# Patient Record
Sex: Male | Born: 1950 | Race: White | Hispanic: No | Marital: Married | Smoking: Never smoker
Health system: Southern US, Community
[De-identification: ages and names within clinical notes are randomized; demographics above are authoritative.]

## PROBLEM LIST (undated history)

## (undated) DIAGNOSIS — K219 Gastro-esophageal reflux disease without esophagitis: Secondary | ICD-10-CM

## (undated) DIAGNOSIS — R945 Abnormal results of liver function studies: Secondary | ICD-10-CM

## (undated) DIAGNOSIS — F419 Anxiety disorder, unspecified: Secondary | ICD-10-CM

## (undated) DIAGNOSIS — M1990 Unspecified osteoarthritis, unspecified site: Secondary | ICD-10-CM

## (undated) DIAGNOSIS — E119 Type 2 diabetes mellitus without complications: Secondary | ICD-10-CM

## (undated) DIAGNOSIS — R7989 Other specified abnormal findings of blood chemistry: Secondary | ICD-10-CM

## (undated) DIAGNOSIS — J302 Other seasonal allergic rhinitis: Secondary | ICD-10-CM

## (undated) DIAGNOSIS — E785 Hyperlipidemia, unspecified: Secondary | ICD-10-CM

## (undated) DIAGNOSIS — G4733 Obstructive sleep apnea (adult) (pediatric): Principal | ICD-10-CM

## (undated) DIAGNOSIS — I10 Essential (primary) hypertension: Secondary | ICD-10-CM

## (undated) HISTORY — DX: Gastro-esophageal reflux disease without esophagitis: K21.9

## (undated) HISTORY — DX: Unspecified osteoarthritis, unspecified site: M19.90

## (undated) HISTORY — DX: Other specified abnormal findings of blood chemistry: R79.89

## (undated) HISTORY — DX: Anxiety disorder, unspecified: F41.9

## (undated) HISTORY — DX: Hyperlipidemia, unspecified: E78.5

## (undated) HISTORY — DX: Obstructive sleep apnea (adult) (pediatric): G47.33

## (undated) HISTORY — DX: Abnormal results of liver function studies: R94.5

## (undated) HISTORY — DX: Type 2 diabetes mellitus without complications: E11.9

## (undated) HISTORY — DX: Essential (primary) hypertension: I10

## (undated) HISTORY — DX: Other seasonal allergic rhinitis: J30.2

## (undated) HISTORY — PX: CATARACT EXTRACTION: SUR2

---

## 2004-10-08 ENCOUNTER — Ambulatory Visit: Payer: Self-pay | Admitting: Gastroenterology

## 2004-10-20 ENCOUNTER — Ambulatory Visit: Payer: Self-pay | Admitting: Gastroenterology

## 2004-10-23 ENCOUNTER — Ambulatory Visit: Payer: Self-pay | Admitting: Gastroenterology

## 2004-11-17 ENCOUNTER — Ambulatory Visit: Payer: Self-pay | Admitting: Gastroenterology

## 2004-12-04 ENCOUNTER — Ambulatory Visit: Payer: Self-pay | Admitting: Gastroenterology

## 2004-12-19 ENCOUNTER — Encounter (INDEPENDENT_AMBULATORY_CARE_PROVIDER_SITE_OTHER): Payer: Self-pay | Admitting: Specialist

## 2004-12-19 ENCOUNTER — Ambulatory Visit (HOSPITAL_COMMUNITY): Admission: RE | Admit: 2004-12-19 | Discharge: 2004-12-19 | Payer: Self-pay | Admitting: Gastroenterology

## 2005-01-26 ENCOUNTER — Ambulatory Visit: Payer: Self-pay | Admitting: Pulmonary Disease

## 2005-03-06 ENCOUNTER — Ambulatory Visit: Payer: Self-pay | Admitting: Pulmonary Disease

## 2005-03-15 ENCOUNTER — Ambulatory Visit (HOSPITAL_BASED_OUTPATIENT_CLINIC_OR_DEPARTMENT_OTHER): Admission: RE | Admit: 2005-03-15 | Discharge: 2005-03-15 | Payer: Self-pay | Admitting: Pulmonary Disease

## 2005-03-27 ENCOUNTER — Ambulatory Visit: Payer: Self-pay | Admitting: Pulmonary Disease

## 2006-11-09 HISTORY — PX: COLONOSCOPY: SHX174

## 2007-10-10 ENCOUNTER — Ambulatory Visit: Payer: Self-pay | Admitting: Gastroenterology

## 2007-10-27 ENCOUNTER — Ambulatory Visit: Payer: Self-pay | Admitting: Gastroenterology

## 2007-10-27 ENCOUNTER — Encounter: Payer: Self-pay | Admitting: Gastroenterology

## 2011-03-27 NOTE — Procedures (Signed)
NAME:  Trevor Ellis, Trevor Ellis NO.:  [id number]   MEDICAL RECORD NO.:  [id number]          PATIENT TYPE:  OUT   LOCATION:  SLEEP CENTER                 FACILITY:  [facility]   PHYSICIAN:  Marcelyn Bruins, M.D. [facility] DATE OF BIRTH:  Apr 01, 1951   DATE OF STUDY:  03/15/2005                              NOCTURNAL POLYSOMNOGRAM   REFERRING PHYSICIAN:  Dr. Marcelyn Bruins   DATE OF STUDY:  Mar 15, 2005   INDICATION FOR THE STUDY:  Hypersomnia with sleep apnea. The patient returns  for pressure optimization.   SLEEP ARCHITECTURE:  The patient had a total sleep time of 322 minutes with  decreased REM and very little slow wave sleep. The patient's sleep  efficiency was 84%. Sleep onset latency was normal as was REM onset.   IMPRESSION:  1.  Continuous positive airway pressure titration study reveals good control      of previously documented severe obstructive sleep apnea with a      continuous positive airway pressure of 18 cm. The patient had fairly      good control at a level of 16 even through rapid eye movement however,      once he turned supine the events began to increase in frequency. There      was some breakthrough snoring noted on this setting but acceptable. The      patient used a medium Ultram Mirage Full Face Mask from home.  2.  No clinically significant cardiac arrhythmias.      KC/MEDQ  D:  03/25/2005 11:46:01  T:  03/25/2005 12:42:51  Job:  [id number]

## 2012-09-08 ENCOUNTER — Encounter: Payer: Self-pay | Admitting: Gastroenterology

## 2012-12-27 ENCOUNTER — Encounter: Payer: Self-pay | Admitting: Gastroenterology

## 2013-01-19 ENCOUNTER — Encounter: Payer: Self-pay | Admitting: Gastroenterology

## 2013-01-19 ENCOUNTER — Ambulatory Visit (AMBULATORY_SURGERY_CENTER): Payer: 59 | Admitting: *Deleted

## 2013-01-19 VITALS — Ht 66.0 in | Wt 248.6 lb

## 2013-01-19 DIAGNOSIS — Z86010 Personal history of colonic polyps: Secondary | ICD-10-CM

## 2013-01-19 MED ORDER — MOVIPREP 100 G PO SOLR: 1.0000 | Freq: Once | ORAL | Status: DC

## 2013-02-02 ENCOUNTER — Ambulatory Visit (AMBULATORY_SURGERY_CENTER): Payer: 59 | Admitting: Gastroenterology

## 2013-02-02 ENCOUNTER — Encounter: Payer: Self-pay | Admitting: Gastroenterology

## 2013-02-02 ENCOUNTER — Other Ambulatory Visit: Payer: Self-pay | Admitting: Gastroenterology

## 2013-02-02 VITALS — BP 128/67 | HR 90 | Temp 98.0°F | Resp 14 | Ht 66.0 in | Wt 248.0 lb

## 2013-02-02 DIAGNOSIS — Z1211 Encounter for screening for malignant neoplasm of colon: Secondary | ICD-10-CM

## 2013-02-02 DIAGNOSIS — Z86010 Personal history of colonic polyps: Secondary | ICD-10-CM

## 2013-02-02 DIAGNOSIS — D126 Benign neoplasm of colon, unspecified: Secondary | ICD-10-CM

## 2013-02-02 MED ORDER — SODIUM CHLORIDE 0.9 % IV SOLN: 500.0000 mL | INTRAVENOUS | Status: DC

## 2013-02-02 NOTE — Progress Notes (Signed)
Patient did not experience any of the following events: a burn prior to discharge; a fall within the facility; wrong site/side/patient/procedure/implant event; or a hospital transfer or hospital admission upon discharge from the facility. (G8907) Patient did not have preoperative order for IV antibiotic SSI prophylaxis. (G8918)  

## 2013-02-02 NOTE — Op Note (Signed)
[facility] [address] [place], [address]   COLONOSCOPY PROCEDURE REPORT  PATIENT: Trevor Ellis, Trevor Ellis  MR#: [id number] BIRTHDATE: 1951/09/16 , 62  yrs. old GENDER: Male ENDOSCOPIST: Meryl Dare, MD, [facility] PROCEDURE DATE:  02/02/2013 PROCEDURE:   Colonoscopy with biopsy and snare polypectomy ASA CLASS:   Class II INDICATIONS:Patient's personal history of adenomatous colon polyps.  MEDICATIONS: MAC sedation, administered by CRNA and propofol (Diprivan) 200mg  IV DESCRIPTION OF PROCEDURE:   After the risks benefits and alternatives of the procedure were thoroughly explained, informed consent was obtained.  A digital rectal exam revealed no abnormalities of the rectum.   The LB CF-[id number]  endoscope was introduced through the anus and advanced to the cecum, which was identified by both the appendix and ileocecal valve. No adverse events experienced.   The quality of the prep was good, using MoviPrep  The instrument was then slowly withdrawn as the colon was fully examined.  COLON FINDINGS: Two sessile polyps measuring 5-7 mm in size were found in the ascending colon and transverse colon.  A polypectomy was performed with a cold snare.  The resection was complete and the polyp tissue was completely retrieved.   A sessile polyp measuring 4 mm in size was found in the rectum. Cold biopsy polypectomy performed. The reestion was complete and the polyp tissue was completely retrieved. The colon was otherwise normal. There was no diverticulosis, inflammation, polyps or cancers unless previously stated.  Retroflexed views revealed small internal hemorrhoids. The time to cecum=2 minutes 32 seconds.  Withdrawal time=12 minutes 37 seconds.  The scope was withdrawn and the procedure completed. COMPLICATIONS: There were no complications.  ENDOSCOPIC IMPRESSION: 1.   Two sessile polyps measuring 5-7 mm in the ascending colon and transverse colon; polypectomy  performed with a cold snare 2.   Sessile polyp measuring 4 mm in size in the rectum: polypectomy with cold biopsy 3.   Small internal hemorrhoids  RECOMMENDATIONS: 1.  Await pathology results 2.  High fiber diet with liberal fluid intake. 3.  Repeat colonoscopy in 5 years  eSigned:  Meryl Dare, MD, [facility] 02/02/2013 9:54 AM   cc: Catha Gosselin, MD

## 2013-02-02 NOTE — Patient Instructions (Addendum)

## 2013-02-03 ENCOUNTER — Telehealth: Payer: Self-pay | Admitting: *Deleted

## 2013-02-03 NOTE — Telephone Encounter (Signed)
  Follow up Call-  Call back number 02/02/2013  Post procedure Call Back phone  # [phone] hm  Permission to leave phone message Yes     Patient questions:  Do you have a fever, pain , or abdominal swelling? no Pain Score  0 *  Have you tolerated food without any problems? yes  Have you been able to return to your normal activities? yes  Do you have any questions about your discharge instructions: Diet   no Medications  no Follow up visit  no  Do you have questions or concerns about your Care? no  Actions: * If pain score is 4 or above: No action needed, pain <4.

## 2013-02-07 ENCOUNTER — Encounter: Payer: Self-pay | Admitting: Gastroenterology

## 2016-01-23 ENCOUNTER — Other Ambulatory Visit: Payer: Self-pay | Admitting: Family Medicine

## 2016-01-23 DIAGNOSIS — M542 Cervicalgia: Secondary | ICD-10-CM

## 2016-01-30 ENCOUNTER — Ambulatory Visit
Admission: RE | Admit: 2016-01-30 | Discharge: 2016-01-30 | Disposition: A | Discharge status: Home / Self Care | Payer: 59 | Source: Ambulatory Visit | Attending: Family Medicine | Admitting: Family Medicine

## 2016-01-30 DIAGNOSIS — M542 Cervicalgia: Secondary | ICD-10-CM

## 2017-11-12 IMAGING — MR MR CERVICAL SPINE W/O CM
4 of 5 series · 30 of 48 positions shown · non-contrast
Comparison: None.

CLINICAL DATA: Neck pain.  Left shoulder and arm pain and numbness

EXAM:
MRI CERVICAL SPINE WITHOUT CONTRAST
TECHNIQUE: Multiplanar, multisequence MR imaging of the cervical spine was
performed. No intravenous contrast was administered.

[Series 5: T1 · sagittal · 3.0mm · 0.66mm/px · 8 of 15 slices shown]
[im 1/15]
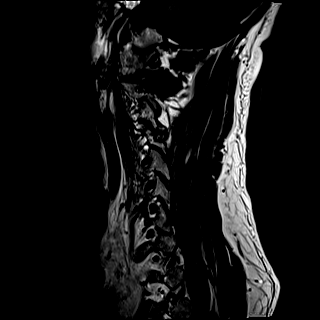
[im 3/15]
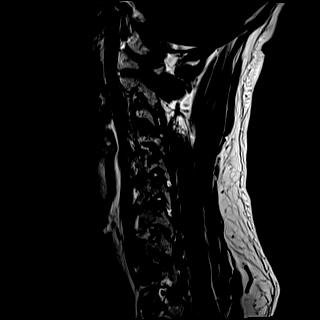
[im 5/15]
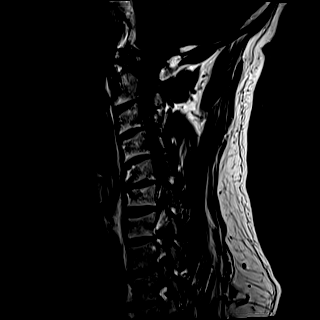
[im 7/15]
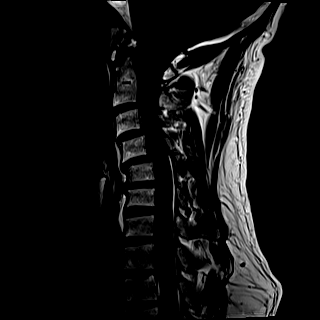
[im 9/15]
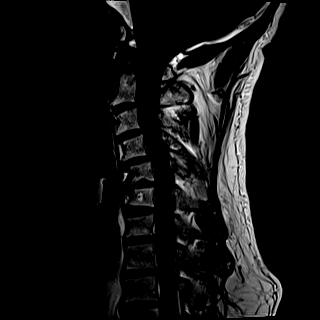
[im 11/15]
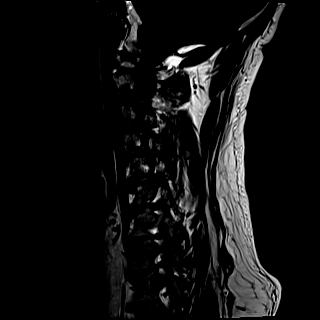
[im 13/15]
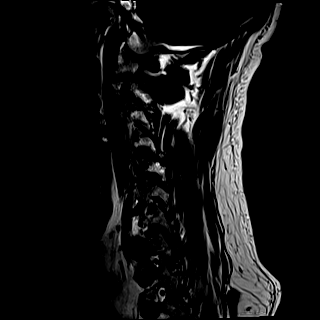
[im 15/15]
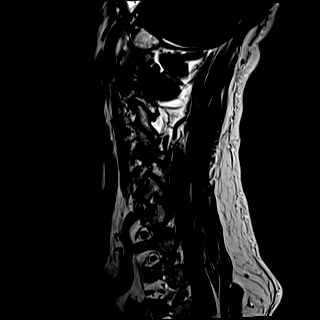

[Series 6: T2 · sagittal · 3.0mm · 0.66mm/px · 7 of 15 slices shown (1 of 2)]
[im 1/15]
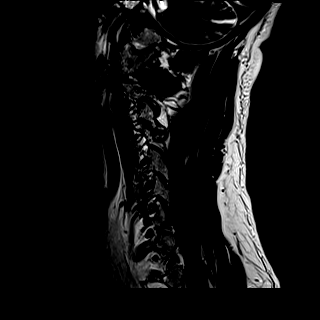
[im 3/15]
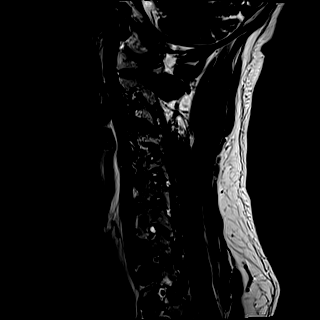
[im 5/15]
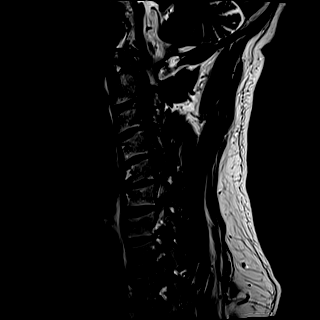
[im 8/15]
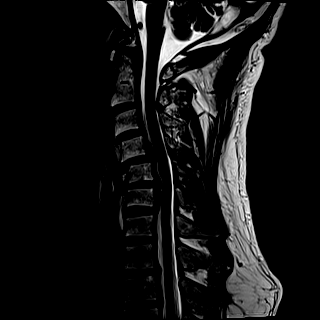
[im 10/15]
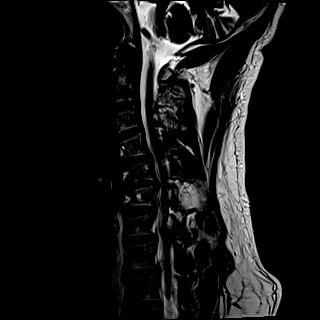
[im 12/15]
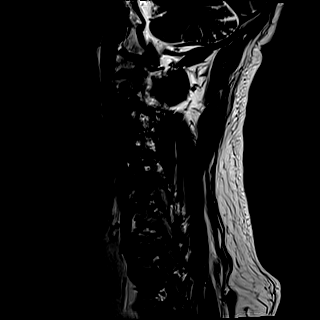
[im 15/15]
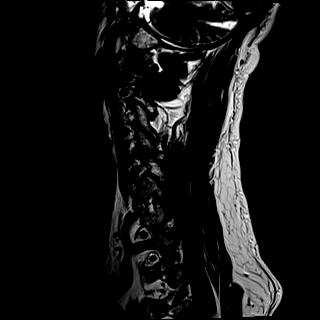

[Series 7: STIR · sagittal · 3.0mm · 0.33mm/px · 6 of 15 slices shown]
[im 1/15]
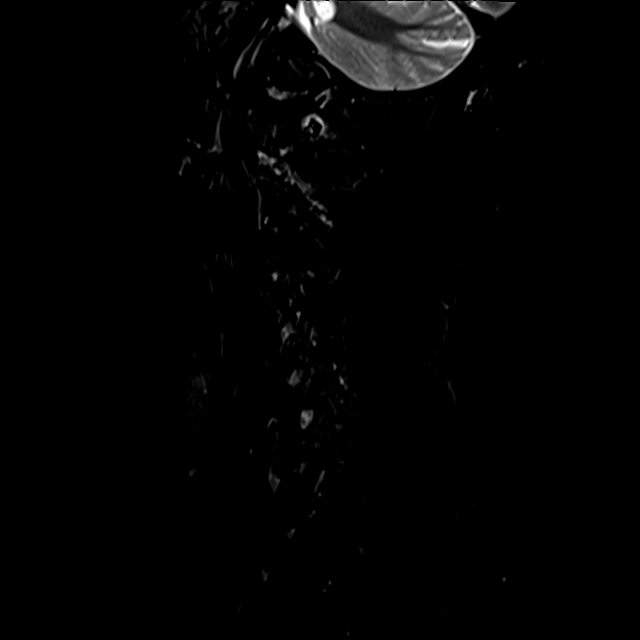
[im 3/15]
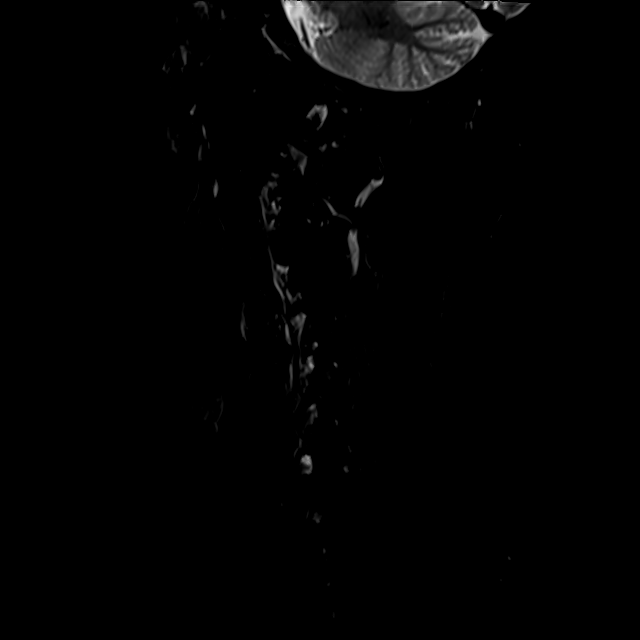
[im 5/15]
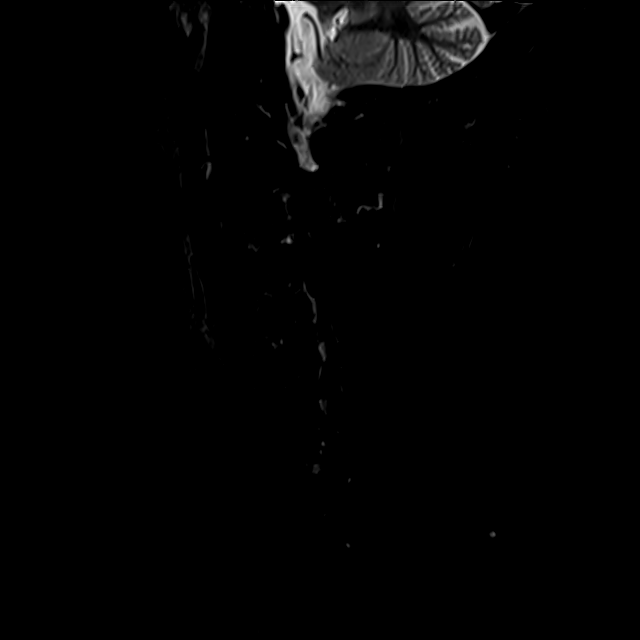
[im 8/15]
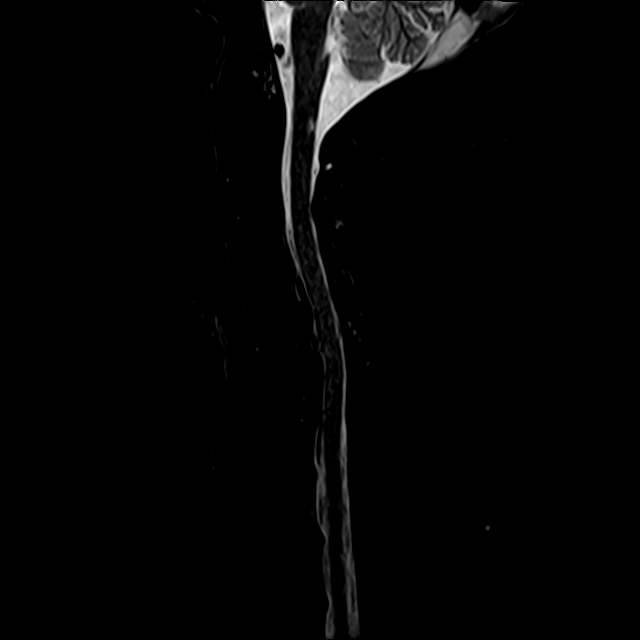
[im 10/15]
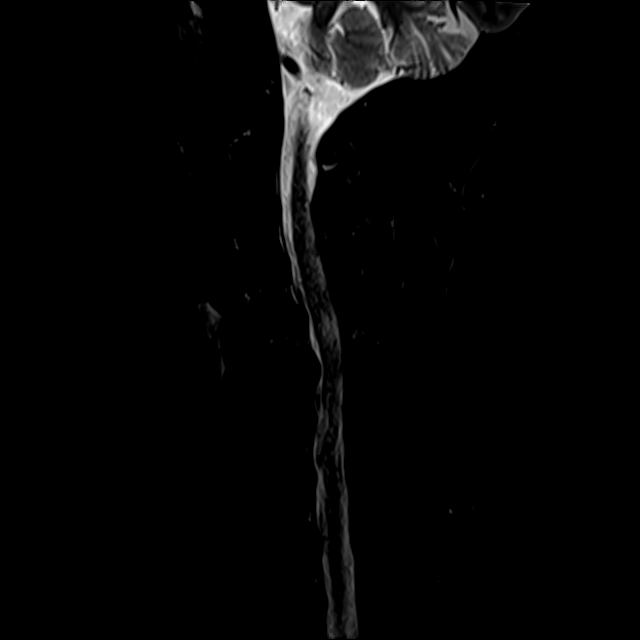
[im 12/15]
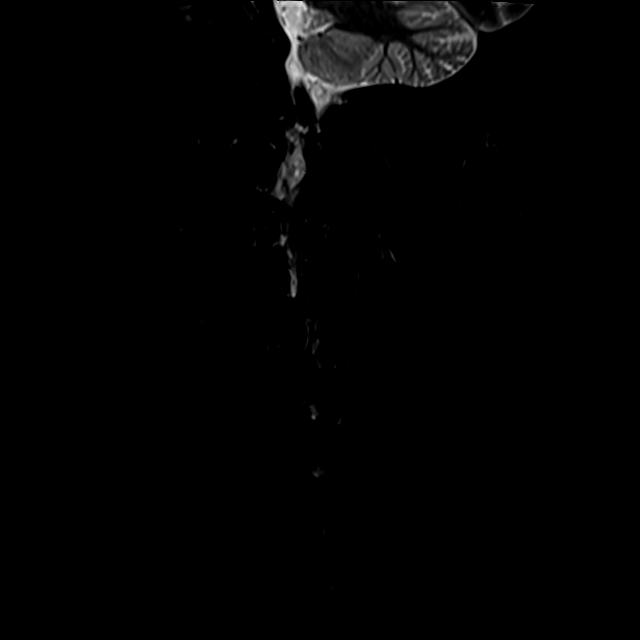

[Series 10: T2 · axial · 3.0mm · 0.50mm/px · z∈[-93,+4]mm · 9 of 28 slices shown (2 of 2)]
[im 1/28]
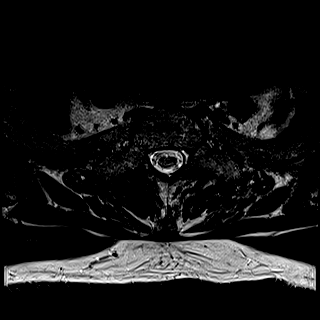
[im 5/28]
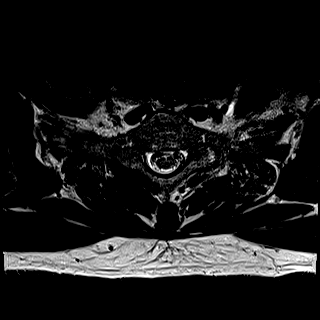
[im 10/28]
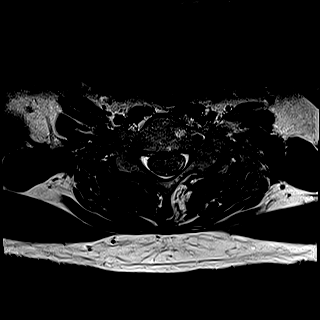
[im 12/28]
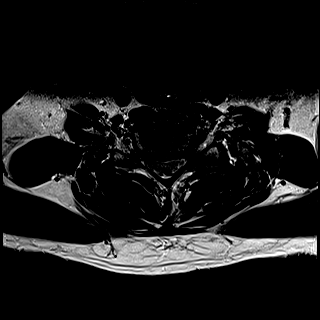
[im 14/28]
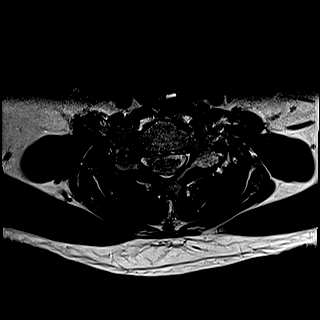
[im 16/28]
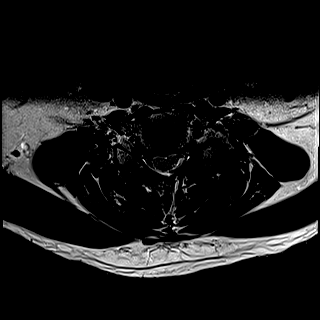
[im 19/28]
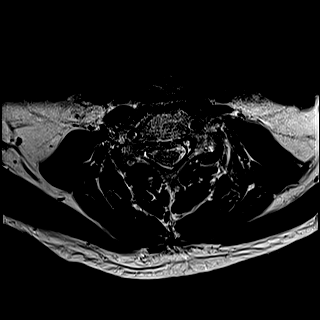
[im 23/28]
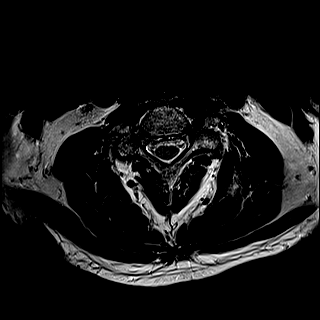
[im 28/28]
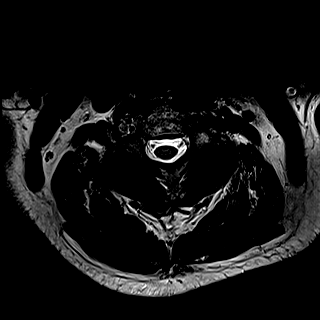

[30 of 48 positions shown; findings below may reference images not displayed]

FINDINGS: Mild to moderate cervical kyphosis. 3 mm anterior slip C4-5.
Remaining alignment normal. No fracture or mass. No cord
compression. Spinal cord signal normal. Cervical medullary junction
normal.

C2-3: Mild uncinate spurring. Bilateral facet hypertrophy. Moderate
foraminal encroachment due to spurring left greater than right. No
cord deformity.

C3-4: Disc degeneration and uncinate spurring on the right. Marked
right facet hypertrophy with severe right foraminal encroachment.
Mild left foraminal narrowing. No cord deformity

C4-5: 3 mm anterior slip with a small central disc protrusion.
Marked left facet degeneration with moderate left foraminal
encroachment. Mild right foraminal encroachment

C5-6: Central disc protrusion with diffuse disc bulging and endplate
spurring. Mild spinal stenosis. Moderate foraminal stenosis
bilaterally.

C6-7: Right-sided disc and osteophyte complex causing right
foraminal encroachment. No cord deformity. Left foramen patent

C7-T1: Mild facet degeneration bilaterally with mild foraminal
narrowing bilaterally

T1-2: Central disc protrusion. Right foraminal encroachment due to
spurring.
IMPRESSION: C2-3 demonstrates moderate foraminal encroachment, left greater than
right

C3-4 demonstrates severe right foraminal encroachment due to
spurring

C4-5 demonstrates 3 mm anterior slip. There is moderate left
foraminal encroachment and mild left foraminal encroachment

C5-6 demonstrates mild spinal stenosis and moderate foraminal
encroachment bilaterally

C6-7 demonstrates right disc and osteophyte complex causing right
foraminal encroachment.

## 2017-11-15 ENCOUNTER — Institutional Professional Consult (permissible substitution): Payer: 59 | Admitting: Pulmonary Disease

## 2017-11-24 ENCOUNTER — Institutional Professional Consult (permissible substitution): Payer: 59 | Admitting: Pulmonary Disease

## 2017-12-21 ENCOUNTER — Ambulatory Visit (INDEPENDENT_AMBULATORY_CARE_PROVIDER_SITE_OTHER): Payer: Medicare Other | Admitting: Pulmonary Disease

## 2017-12-21 ENCOUNTER — Encounter: Payer: Self-pay | Admitting: Pulmonary Disease

## 2017-12-21 VITALS — BP 124/78 | HR 86 | Ht 66.0 in | Wt 237.0 lb

## 2017-12-21 DIAGNOSIS — G4733 Obstructive sleep apnea (adult) (pediatric): Secondary | ICD-10-CM

## 2017-12-21 NOTE — Progress Notes (Signed)
[facility]  Chief Complaint  Patient presents with  . Sleep Consult    Pt has cpap machine over 66 yrs old, DME Sleep Med. Pt requesting new machine and supplies    Vital signs: BP 124/78 (BP Location: Left Arm, Cuff Size: Large)   Pulse 86   Ht 5\' 6"  (1.676 m)   Wt 237 lb (107.5 kg)   SpO2 98%   BMI 38.25 kg/m   History of Present Illness: Trevor Ellis is a 67 y.o. male for evaluation of sleep problems.  He had a sleep study years ago and was found to have severe sleep apnea.  He has been using CPAP ever since.  His machine is several years old and not working as well as before.  He would snore and stop breathing prior to getting CPAP.  His sleep would also be disrupted.    He goes to sleep at 11 pm.  He falls asleep after 15 minutes.  He sleeps through the night.  He gets out of bed at 10 am.  He feels okay in the morning.  He denies morning headache.  He does not use anything to help him fall sleep or stay awake.  He denies sleep walking, sleep talking, bruxism, or nightmares.  There is no history of restless legs.  He denies sleep hallucinations, sleep paralysis, or cataplexy.  The Epworth score is 12 out of 24.    Physical Exam:  General - pleasant Eyes - pupils reactive ENT - no sinus tenderness, no oral exudate, no LAN, scalloped tongue, MP 3 Cardiac - regular, no murmur Chest - no wheeze, rales Abd - soft, non tender Ext - no edema Skin - no rashes Neuro - normal strength Psych - normal mood  Discussion: He has history of obstructive sleep apnea and has been on CPAP.  His CPAP is quite old.  His sleep study is from several years ago.  He has a history of anxiety, DM, and HTN.    We discussed how sleep apnea can affect various health problems, including risks for hypertension, cardiovascular disease, and diabetes.  We also discussed how sleep disruption can increase risks for accidents, such as while driving.  Weight  loss as a means of improving sleep apnea was also reviewed.  Additional treatment options discussed were CPAP therapy, oral appliance, and surgical intervention.  Assessment/Plan:  Obstructive sleep apnea. - will arrange for home sleep study to determine current status of sleep apnea - assuming he still has sleep apnea, will then plan to arrange for new auto CPAP   Patient Instructions  Will arrange for home sleep study Will call to arrange for follow up after sleep study reviewed    Chesley Mires, MD [place] 12/21/2017, 10:27 AM Pager:  [phone]  Flow Sheet  Sleep tests:   Review of Systems: Constitutional: Negative for fever and unexpected weight change.  HENT: Positive for congestion, dental problem, ear pain, sore throat and trouble swallowing. Negative for nosebleeds, postnasal drip, rhinorrhea, sinus pressure and sneezing.   Eyes: Negative for redness and itching.  Respiratory: Positive for cough, shortness of breath and wheezing. Negative for chest tightness.   Cardiovascular: Negative for palpitations and leg swelling.  Gastrointestinal: Negative for nausea and vomiting.  Genitourinary: Negative for dysuria.  Musculoskeletal: Negative for joint swelling.  Skin: Negative for rash.  Allergic/Immunologic: Positive for environmental allergies. Negative for food allergies and immunocompromised state.  Neurological: Negative for headaches.  Hematological: Does not  bruise/bleed easily.  Psychiatric/Behavioral: Negative for dysphoric mood. The patient is not nervous/anxious.    Past Medical History: He  has a past medical history of Anxiety, Diabetes ([place]), Elevated LFTs, GERD (gastroesophageal reflux disease), Hyperlipidemia, Hypertension, and Seasonal allergies.  Past Surgical History: He  has a past surgical history that includes Colonoscopy (2008).  Family History: His family history is not on file.  Social History: He  reports that  has  never smoked. he has never used smokeless tobacco. He reports that he drinks alcohol. He reports that he does not use drugs.  Medications: Allergies as of 12/21/2017   No Known Allergies     Medication List        Accurate as of 12/21/17 10:27 AM. Always use your most recent med list.          chlorthalidone 25 MG tablet Commonly known as:  HYGROTON Take 25 mg by mouth daily.   colesevelam 625 MG tablet Commonly known as:  WELCHOL Take 1,875 mg by mouth 2 (two) times daily with a meal.   glimepiride 1 MG tablet Commonly known as:  AMARYL Take 1 mg by mouth daily before breakfast.   JARDIANCE 10 MG Tabs tablet Generic drug:  empagliflozin Take 10 mg by mouth daily.   lisinopril 40 MG tablet Commonly known as:  PRINIVIL,ZESTRIL Take 40 mg by mouth daily.   metFORMIN 500 MG tablet Commonly known as:  GLUCOPHAGE Take 500 mg by mouth 2 (two) times daily with a meal.   ranitidine 150 MG tablet Commonly known as:  ZANTAC Take 150 mg by mouth 2 (two) times daily.   venlafaxine 75 MG tablet Commonly known as:  EFFEXOR Take 75 mg by mouth 2 (two) times daily.

## 2017-12-21 NOTE — Progress Notes (Signed)
   Subjective:    Patient ID: Trevor Ellis, male    DOB: 03-19-1951, 67 y.o.   MRN: [id number]  HPI    Review of Systems  Constitutional: Negative for fever and unexpected weight change.  HENT: Positive for congestion, dental problem, ear pain, sore throat and trouble swallowing. Negative for nosebleeds, postnasal drip, rhinorrhea, sinus pressure and sneezing.   Eyes: Negative for redness and itching.  Respiratory: Positive for cough, shortness of breath and wheezing. Negative for chest tightness.   Cardiovascular: Negative for palpitations and leg swelling.  Gastrointestinal: Negative for nausea and vomiting.  Genitourinary: Negative for dysuria.  Musculoskeletal: Negative for joint swelling.  Skin: Negative for rash.  Allergic/Immunologic: Positive for environmental allergies. Negative for food allergies and immunocompromised state.  Neurological: Negative for headaches.  Hematological: Does not bruise/bleed easily.  Psychiatric/Behavioral: Negative for dysphoric mood. The patient is not nervous/anxious.        Objective:   Physical Exam        Assessment & Plan:

## 2017-12-21 NOTE — Patient Instructions (Signed)
Will arrange for home sleep study Will call to arrange for follow up after sleep study reviewed  

## 2018-01-04 ENCOUNTER — Encounter: Payer: Self-pay | Admitting: Pulmonary Disease

## 2018-01-04 ENCOUNTER — Telehealth: Payer: Self-pay | Admitting: Pulmonary Disease

## 2018-01-04 DIAGNOSIS — G4733 Obstructive sleep apnea (adult) (pediatric): Secondary | ICD-10-CM

## 2018-01-04 HISTORY — DX: Obstructive sleep apnea (adult) (pediatric): G47.33

## 2018-01-04 NOTE — Telephone Encounter (Signed)
HST 01/04/18 >> AHI 81.2, SaO2 low 62%.   Will have my nurse inform pt that sleep study shows severe sleep apnea.  Options are 1) CPAP now, 2) ROV first.  If He is agreeable to CPAP, then please send order for auto CPAP range 5 to 15 cm H2O with heated humidity and mask of choice.  Have download sent 1 month after starting CPAP and set up ROV 2 months after starting CPAP.  ROV can be with me or NP.

## 2018-01-05 ENCOUNTER — Other Ambulatory Visit: Payer: Self-pay | Admitting: *Deleted

## 2018-01-05 DIAGNOSIS — G4733 Obstructive sleep apnea (adult) (pediatric): Secondary | ICD-10-CM | POA: Diagnosis not present

## 2018-01-05 NOTE — Telephone Encounter (Signed)
Patient returning call, CB is [phone]

## 2018-01-05 NOTE — Telephone Encounter (Signed)
Spoke with patient. He is aware of results. He is already using a CPAP machine and would like to order a new one. Advised him that I will place the order today for a new machine. Also advised him to call us as soon as he receives his new machine so that we can get him scheduled for a follow up.   He verbalized understanding. Nothing else needed at time of call.

## 2018-01-05 NOTE — Telephone Encounter (Signed)
Left voice mail on machine for patient to return phone call back regarding results. X1  

## 2018-01-13 ENCOUNTER — Telehealth: Payer: Self-pay | Admitting: Pulmonary Disease

## 2018-01-13 DIAGNOSIS — G4733 Obstructive sleep apnea (adult) (pediatric): Secondary | ICD-10-CM

## 2018-01-13 NOTE — Telephone Encounter (Signed)
Trevor Ellis in [facility] called and spoke with patient regarding changing DME companies. Pt is good in changing DME company today Cancelled first order Placed order for cpap machine and supplies for autp cpap 5-15cm, mask of choice and supplies Nothing further needed at this time

## 2018-01-15 ENCOUNTER — Encounter: Payer: Self-pay | Admitting: Gastroenterology

## 2018-01-25 ENCOUNTER — Telehealth: Payer: Self-pay | Admitting: Pulmonary Disease

## 2018-01-25 NOTE — Telephone Encounter (Signed)
Left voice mail on machine for patient to return phone call back regarding needing an f/u appt for new cpap machine set up. The appt needs scheduled between 02/25/18 thru 03/20/2018. X1  Will follow up

## 2018-02-09 ENCOUNTER — Telehealth: Payer: Self-pay | Admitting: Pulmonary Disease

## 2018-02-09 NOTE — Telephone Encounter (Signed)
rec'd fax from [facility] regarding pt needing appt with [facility] for f/u on new cpap set up Called and spoke with pt; needing appt b/w 02/25/2018 - 04/25/2018 Scheduled appt for 04/11/2018 at 10:45am Nothing further needed at this time

## 2018-03-03 NOTE — Telephone Encounter (Signed)
Kelli please advise Patient is scheduled to see [facility] on 6.3.19 Your previous documentation states pt needs to be seen between 4.19.19 and 5.12.19 but the 6.3.19 does fall within 90 days of initial setup

## 2018-03-04 NOTE — Telephone Encounter (Signed)
Called and spoke with patient rescheduled appt sooner to fall in time period needed for compliance Nothing further needed at this time

## 2018-03-31 NOTE — Progress Notes (Signed)
@Patient  ID: Trevor Ellis, male    DOB: August 18, 1951, 67 y.o.   MRN: [id number]  Chief Complaint  Patient presents with  . Follow-up    OSA    Referring provider: Hulan Fess, MD  HPI: 67 year old male patient seen for obstructive sleep apnea.  Patient of Dr. Halford Chessman. He  has a past medical history of Anxiety, Diabetes ([place]), Elevated LFTs, GERD (gastroesophageal reflux disease), Hyperlipidemia, Hypertension, and Seasonal allergies.  Recent [facility] Encounters:   12/21/2017-office visit-obstructive sleep apnea We will arrange home sleep study, after home sleep study will arrange an order for new CPAP.  Tests:  01/04/18-Home sleep study- AHI of 81.2 events an hour with 559 desaturations, lowest SaO2 was 62% with an average of 90%.  03/31/18  OV - CPAP restart  Patient reporting good compliance.  Enjoying recent CPAP use.  Compliance report shows usage 30 out of 30 days, 27 of those days are greater than 4 hours, average usage 5 hours 25 minutes.  With an AHI of 13.1.  Patient reporting that this is accurate for his sleep.  Reporting that his wife keeps him up at night with screen time, as well as not letting him fall asleep, sometimes resulting to him not going to sleep till 7 in the morning.  Patient denies caffeine use, or new medication changes.  Patient admits that he knows he needs a more focus on sleep hygiene.  Patient states that since using CPAP he has been doing better with pain more consistent amount of sleep, but knows that he still has room for improving.  Patient admits that sometimes he does not nap or sleep in a recliner.  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  Patient does report that his previous CPAP gave him a little bit more pressure than this current one.  He is interested in potentially getting more pressure of thoughts and option  on this machine.   No Known Allergies  Immunization History  Administered Date(s) Administered  . Influenza Split 08/20/2017  . Pneumococcal Conjugate-13 05/17/2017    Past Medical History:  Diagnosis Date  . Anxiety   . Diabetes ([place])   . Elevated LFTs   . GERD (gastroesophageal reflux disease)   . Hyperlipidemia   . Hypertension   . OSA (obstructive sleep apnea) 01/04/2018  . Seasonal allergies     Tobacco History: Social History   Tobacco Use  Smoking Status Never Smoker  Smokeless Tobacco Never Used   Counseling given: Not Answered   Outpatient Encounter Medications as of 04/01/2018  Medication Sig  . atorvastatin (LIPITOR) 40 MG tablet Take 40 mg by mouth daily.  . chlorthalidone (HYGROTON) 25 MG tablet Take 25 mg by mouth daily.  . colesevelam (WELCHOL) 625 MG tablet Take 1,875 mg by mouth 2 (two) times daily with a meal.  . empagliflozin (JARDIANCE) 10 MG TABS tablet Take 10 mg by mouth daily.  Marland Kitchen glimepiride (AMARYL) 1 MG tablet Take 1 mg by mouth daily before breakfast.  . lisinopril (PRINIVIL,ZESTRIL) 40 MG tablet Take 40 mg by mouth daily.  . metFORMIN (GLUCOPHAGE) 500 MG tablet Take 500 mg by mouth 2 (two) times daily with a meal.  . ranitidine (ZANTAC) 150 MG tablet Take 150 mg by mouth 2 (two) times daily.  Marland Kitchen venlafaxine (EFFEXOR) 75 MG tablet Take 75 mg by mouth 2 (two) times daily.   No  facility-administered encounter medications on file as of 04/01/2018.      Review of Systems  Constitutional: +fatigue, some daytime sleepiness  No  weight loss, night sweats,  fevers, chills HEENT:   No headaches,  Difficulty swallowing,  Tooth/dental problems, or  Sore throat, No sneezing, itching, ear ache, nasal congestion, post nasal drip  CV: No chest pain,  orthopnea, PND, swelling in lower extremities, anasarca, dizziness, palpitations, syncope  GI: No heartburn, indigestion, abdominal pain, nausea, vomiting, diarrhea, change in bowel habits, loss of appetite,  bloody stools Resp: No shortness of breath with exertion or at rest.  No excess mucus, no productive cough,  No non-productive cough,  No coughing up of blood.  No change in color of mucus.  No wheezing.  No chest wall deformity Skin: no rash, lesions, no skin changes. GU: no dysuria, change in color of urine, no urgency or frequency.  No flank pain, no hematuria  MS:  No joint pain or swelling.  No decreased range of motion.  No back pain. Psych:  No change in mood or affect. No depression or anxiety.  No memory loss.   Physical Exam  BP 132/64 (BP Location: Left Arm, Cuff Size: Normal)   Pulse 67   Ht 5' 6.5" (1.689 m)   Wt 233 lb 9.6 oz (106 kg)   SpO2 (!) 67%   BMI 37.14 kg/m   GEN: A/Ox3; pleasant , NAD, well nourished    HEENT:  Muse/AT,  EACs-clear, cerumen in left canal, TMs-wnl, NOSE- erythematous, THROAT- Mallampati III, clear, no lesions, no postnasal drip or exudate noted.   NECK:  Supple w/ fair ROM; no JVD; no lymphadenopathy.    RESP:  Clear  P & A; w/o, wheezes/ rales/ or rhonchi. no accessory muscle use, no dullness to percussion  CARD:  +trace LE edema, RRR, no m/r/g, pulses intact, no cyanosis or clubbing.  GI:   Soft & nt; nml bowel sounds; no organomegaly or masses detected.   Musco: Warm bil, no deformities or joint swelling noted.   Neuro: alert, no focal deficits noted.    Skin: Warm, no lesions or rashes    Lab Results:  CBC No results found for: WBC, RBC, HGB, HCT, PLT, MCV, MCH, MCHC, RDW, LYMPHSABS, MONOABS, EOSABS, BASOSABS  BMET No results found for: NA, K, CL, CO2, GLUCOSE, BUN, CREATININE, CALCIUM, GFRNONAA, GFRAA  BNP No results found for: BNP  ProBNP No results found for: PROBNP  Imaging: No results found.   Assessment & Plan:   Pleasant 67 year old male patient presents today for obstructive sleep apnea/CPAP follow-up.  Will make pressure setting changes to give him more pressure in the beginning (up to 10cm) as well as to  increase his max pressure to 20 cm.  Patient follow-up in 6 months.  Patient extensively educated on sleep hygiene, tracking his sleep, using CPAP daily, and having close follow-up with our office if any sleep changes or CPAP issues occur.  OSA (obstructive sleep apnea) Pressure settings to your machine today >>>start pressure at 10cmH20 >>increase max pressure to 20cmH20 >>>We will do 1 month download after these changes   Continue to work on good sleep hygiene trying to achieve at least 6 hours of sleep a night with CPAP use. Try to obtain sleep within the same parameters every night for example may be between 11 PM and 5 AM for instance Limit device and screen time use during sleeping If you have any sort of issues or concerns, or problems  inhibiting you to use her device please follow-up with the office Follow-up in 6 months with Dr. Royanne Foots, NP 04/01/2018

## 2018-04-01 ENCOUNTER — Ambulatory Visit (INDEPENDENT_AMBULATORY_CARE_PROVIDER_SITE_OTHER): Payer: Medicare Other | Admitting: Pulmonary Disease

## 2018-04-01 ENCOUNTER — Encounter: Payer: Self-pay | Admitting: Pulmonary Disease

## 2018-04-01 VITALS — BP 132/64 | HR 67 | Ht 66.5 in | Wt 233.6 lb

## 2018-04-01 DIAGNOSIS — G4733 Obstructive sleep apnea (adult) (pediatric): Secondary | ICD-10-CM | POA: Diagnosis not present

## 2018-04-01 NOTE — Patient Instructions (Addendum)
Pressure settings to your machine today >>>start pressure at 10cmH20 >>increase max pressure to 20cmH20 >>>We will do 1 month download after these changes   Continue to work on good sleep hygiene trying to achieve at least 6 hours of sleep a night with CPAP use. Try to obtain sleep within the same parameters every night for example may be between 11 PM and 5 AM for instance Limit device and screen time use during sleeping If you have any sort of issues or concerns, or problems inhibiting you to use her device please follow-up with the office Follow-up in 6 months with Dr. Halford Chessman     . Keep up the hard work using your device.  . Do not drive or operate heavy machinery if tired or drowsy.  . Please notify the supply company and office if you are unable to use your device regularly due to missing supplies or machine being broken.  . Work on maintaining a healthy weight and following your recommended nutrition plan  . Maintain proper daily exercise and movement  . Maintaining proper use of your device can also help improve management of other chronic illnesses such as: Blood pressure, blood sugars, and weight management.   CPAP Cleaning:  Clean weekly, with Dawn soap, and bottle brush.  Set up to air dry.   Please contact the office if your symptoms worsen or you have concerns that you are not improving.   Thank you for choosing [facility] for your healthcare, and for allowing [place] to partner with you on your healthcare journey. I am thankful to be able to provide care to you today.   Wyn Quaker FNP-C

## 2018-04-01 NOTE — Assessment & Plan Note (Signed)
Pressure settings to your machine today >>>start pressure at 10cmH20 >>increase max pressure to 20cmH20 >>>We will do 1 month download after these changes   Continue to work on good sleep hygiene trying to achieve at least 6 hours of sleep a night with CPAP use. Try to obtain sleep within the same parameters every night for example may be between 11 PM and 5 AM for instance Limit device and screen time use during sleeping If you have any sort of issues or concerns, or problems inhibiting you to use her device please follow-up with the office Follow-up in 6 months with Dr. Halford Chessman

## 2018-04-07 ENCOUNTER — Telehealth: Payer: Self-pay | Admitting: Pulmonary Disease

## 2018-04-07 NOTE — Telephone Encounter (Signed)
Left voice mail on machine for patient to return phone call back regarding scheduling f/u appt for new cpap set up. X1

## 2018-04-07 NOTE — Telephone Encounter (Signed)
Pt had an appt scheduled 04/11/18 at 10:45 that was cancelled due to pt having an appt with Wyn Quaker, NP 04/01/18.  Called and spoke with pt regarding the visit and pt stated that visit was due to the new cpap set up. Pt is to follow up with VS 6 months from the visit 5/24.  Nothing further needed at this time.

## 2018-04-11 ENCOUNTER — Ambulatory Visit: Payer: Medicare Other | Admitting: Pulmonary Disease

## 2018-05-18 ENCOUNTER — Encounter: Payer: Self-pay | Admitting: Gastroenterology

## 2018-07-21 ENCOUNTER — Ambulatory Visit (AMBULATORY_SURGERY_CENTER): Payer: Self-pay | Admitting: *Deleted

## 2018-07-21 ENCOUNTER — Other Ambulatory Visit: Payer: Self-pay

## 2018-07-21 ENCOUNTER — Encounter: Payer: Self-pay | Admitting: Gastroenterology

## 2018-07-21 VITALS — Ht 66.0 in | Wt 239.4 lb

## 2018-07-21 DIAGNOSIS — Z86010 Personal history of colonic polyps: Secondary | ICD-10-CM

## 2018-07-21 MED ORDER — PEG 3350-KCL-NA BICARB-NACL 420 G PO SOLR: 4000.0000 mL | Freq: Once | ORAL | 0 refills | Status: AC

## 2018-07-21 NOTE — Progress Notes (Signed)
No egg or soy allergy known to patient  No issues with past sedation with any surgeries  or procedures, no intubation problems  No diet pills per patient No home 02 use per patient  No blood thinners per patient  Pt denies issues with constipation  No A fib or A flutter  EMMI video  Offered and declined by the patient Patient stating his brother had been diagnosed with colon cancer since the patient's last procedure

## 2018-08-03 ENCOUNTER — Encounter: Payer: Self-pay | Admitting: Gastroenterology

## 2018-08-03 ENCOUNTER — Ambulatory Visit (AMBULATORY_SURGERY_CENTER): Payer: Medicare Other | Admitting: Gastroenterology

## 2018-08-03 VITALS — BP 101/50 | HR 78 | Temp 98.0°F | Resp 16 | Ht 66.0 in | Wt 239.0 lb

## 2018-08-03 DIAGNOSIS — D122 Benign neoplasm of ascending colon: Secondary | ICD-10-CM | POA: Diagnosis not present

## 2018-08-03 DIAGNOSIS — D123 Benign neoplasm of transverse colon: Secondary | ICD-10-CM | POA: Diagnosis not present

## 2018-08-03 DIAGNOSIS — D125 Benign neoplasm of sigmoid colon: Secondary | ICD-10-CM | POA: Diagnosis not present

## 2018-08-03 DIAGNOSIS — Z86010 Personal history of colonic polyps: Secondary | ICD-10-CM

## 2018-08-03 MED ORDER — SODIUM CHLORIDE 0.9 % IV SOLN: 500.0000 mL | Freq: Once | INTRAVENOUS | Status: DC

## 2018-08-03 NOTE — Progress Notes (Signed)
To PACU, VSS. Report to [email or web address] 

## 2018-08-03 NOTE — Progress Notes (Signed)
Called to room to assist during endoscopic procedure.  Patient ID and intended procedure confirmed with present staff. Received instructions for my participation in the procedure from the performing physician.  

## 2018-08-03 NOTE — Op Note (Signed)
[facility] Patient Name: Gillian Kluever Procedure Date: 08/03/2018 8:28 AM MRN: [id number] Endoscopist: Ladene Artist , MD Age: 67 Referring MD:  Date of Birth: 12/18/1950 Gender: Male Account #: [id number] Procedure:                Colonoscopy Indications:              Surveillance: Personal history of adenomatous                            polyps on last colonoscopy 5 years ago Medicines:                Monitored Anesthesia Care Procedure:                Pre-Anesthesia Assessment:                           - Prior to the procedure, a History and Physical                            was performed, and patient medications and                            allergies were reviewed. The patient's tolerance of                            previous anesthesia was also reviewed. The risks                            and benefits of the procedure and the sedation                            options and risks were discussed with the patient.                            All questions were answered, and informed consent                            was obtained. Prior Anticoagulants: The patient has                            taken no previous anticoagulant or antiplatelet                            agents. ASA Grade Assessment: II - A patient with                            mild systemic disease. After reviewing the risks                            and benefits, the patient was deemed in                            satisfactory condition to undergo the procedure.  After obtaining informed consent, the colonoscope                            was passed under direct vision. Throughout the                            procedure, the patient's blood pressure, pulse, and                            oxygen saturations were monitored continuously. The                            Model CF-HQ190L [id number]) scope was introduced                            through the anus and  advanced to the the cecum,                            identified by appendiceal orifice and ileocecal                            valve. The ileocecal valve, appendiceal orifice,                            and rectum were photographed. The quality of the                            bowel preparation was good. The patient tolerated                            the procedure well. The colonoscopy was somewhat                            difficult due to a redundant colon, significant                            looping and a tortuous colon. Successful completion                            of the procedure was aided by changing the patient                            to a prone position, withdrawing and reinserting                            the scope, straightening and shortening the scope                            to obtain bowel loop reduction and applying                            abdominal pressure. Scope In: 8:32:40 AM Scope Out: 9:01:27 AM Scope Withdrawal Time: 0 hours 17 minutes 0 seconds  Total  Procedure Duration: 0 hours 28 minutes 47 seconds  Findings:                 The perianal and digital rectal examinations were                            normal.                           A 8 mm polyp was found in the transverse colon. The                            polyp was sessile. The polyp was removed with a hot                            snare. Resection and retrieval were complete.                           A 6 mm polyp was found in the sigmoid colon. The                            polyp was sessile. The polyp was removed with a                            cold snare. Resection and retrieval were complete.                           Four sessile polyps were found in the transverse                            colon (1) and ascending colon (3). The polyps were                            4 to 5 mm in size. These polyps were removed with a                            cold biopsy forceps. Resection  and retrieval were                            complete.                           Internal hemorrhoids were found during                            retroflexion. The hemorrhoids were small and Grade                            I (internal hemorrhoids that do not prolapse).                           The exam was otherwise without abnormality on  direct and retroflexion views. Complications:            No immediate complications. Estimated blood loss:                            None. Estimated Blood Loss:     Estimated blood loss: none. Impression:               - One 8 mm polyp in the transverse colon, removed                            with a hot snare. Resected and retrieved.                           - One 6 mm polyp in the sigmoid colon, removed with                            a cold snare. Resected and retrieved.                           - Four 4 to 5 mm polyps in the transverse colon and                            in the ascending colon, removed with a cold biopsy                            forceps. Resected and retrieved.                           - Internal hemorrhoids.                           - The examination was otherwise normal on direct                            and retroflexion views. Recommendation:           - Repeat colonoscopy in 3 - 5 years for                            surveillance pending pathology review.                           - Patient has a contact number available for                            emergencies. The signs and symptoms of potential                            delayed complications were discussed with the                            patient. Return to normal activities tomorrow.  Written discharge instructions were provided to the                            patient.                           - Resume previous diet.                           - Continue present medications.                            - Await pathology results.                           - No aspirin, ibuprofen, naproxen, or other                            non-steroidal anti-inflammatory drugs for 2 weeks                            after polyp removal. Ladene Artist, MD 08/03/2018 9:08:19 AM This report has been signed electronically.

## 2018-08-03 NOTE — Progress Notes (Signed)
Pt's states no medical or surgical changes since previsit or office visit. 

## 2018-08-03 NOTE — Patient Instructions (Signed)
Please read handouts on Polyps and hemorrhoids. No Aspirin, Ibuprofen Naproxen, or other non-steriodal anti-inflammatory drugs for 2 weeks. Continue present medications.    YOU HAD AN ENDOSCOPIC PROCEDURE TODAY AT [facility]:   Refer to the procedure report that was given to you for any specific questions about what was found during the examination.  If the procedure report does not answer your questions, please call your gastroenterologist to clarify.  If you requested that your care partner not be given the details of your procedure findings, then the procedure report has been included in a sealed envelope for you to review at your convenience later.  YOU SHOULD EXPECT: Some feelings of bloating in the abdomen. Passage of more gas than usual.  Walking can help get rid of the air that was put into your GI tract during the procedure and reduce the bloating. If you had a lower endoscopy (such as a colonoscopy or flexible sigmoidoscopy) you may notice spotting of blood in your stool or on the toilet paper. If you underwent a bowel prep for your procedure, you may not have a normal bowel movement for a few days.  Please Note:  You might notice some irritation and congestion in your nose or some drainage.  This is from the oxygen used during your procedure.  There is no need for concern and it should clear up in a day or so.  SYMPTOMS TO REPORT IMMEDIATELY:   Following lower endoscopy (colonoscopy or flexible sigmoidoscopy):  Excessive amounts of blood in the stool  Significant tenderness or worsening of abdominal pains  Swelling of the abdomen that is new, acute  Fever of 100F or higher    For urgent or emergent issues, a gastroenterologist can be reached at any hour by calling [phone].   DIET:  We do recommend a small meal at first, but then you may proceed to your regular diet.  Drink plenty of fluids but you should avoid alcoholic beverages for 24  hours.  ACTIVITY:  You should plan to take it easy for the rest of today and you should NOT DRIVE or use heavy machinery until tomorrow (because of the sedation medicines used during the test).    FOLLOW UP: Our staff will call the number listed on your records the next business day following your procedure to check on you and address any questions or concerns that you may have regarding the information given to you following your procedure. If we do not reach you, we will leave a message.  However, if you are feeling well and you are not experiencing any problems, there is no need to return our call.  We will assume that you have returned to your regular daily activities without incident.  If any biopsies were taken you will be contacted by phone or by letter within the next 1-3 weeks.  Please call us at [phone] if you have not heard about the biopsies in 3 weeks.    SIGNATURES/CONFIDENTIALITY: You and/or your care partner have signed paperwork which will be entered into your electronic medical record.  These signatures attest to the fact that that the information above on your After Visit Summary has been reviewed and is understood.  Full responsibility of the confidentiality of this discharge information lies with you and/or your care-partner.

## 2018-08-04 ENCOUNTER — Telehealth: Payer: Self-pay

## 2018-08-04 NOTE — Telephone Encounter (Signed)
  Follow up Call-  Call back number 08/03/2018  Post procedure Call Back phone  # [phone]  Permission to leave phone message Yes  Some recent data might be hidden     Patient questions:  Do you have a fever, pain , or abdominal swelling? No. Pain Score  0 *  Have you tolerated food without any problems? Yes.    Have you been able to return to your normal activities? Yes.    Do you have any questions about your discharge instructions: Diet   No. Medications  No. Follow up visit  No.  Do you have questions or concerns about your Care? No.  Actions: * If pain score is 4 or above: No action needed, pain <4.

## 2018-08-19 ENCOUNTER — Encounter: Payer: Self-pay | Admitting: Gastroenterology

## 2018-09-28 ENCOUNTER — Ambulatory Visit (INDEPENDENT_AMBULATORY_CARE_PROVIDER_SITE_OTHER): Payer: Medicare Other | Admitting: Pulmonary Disease

## 2018-09-28 ENCOUNTER — Encounter: Payer: Self-pay | Admitting: Pulmonary Disease

## 2018-09-28 VITALS — BP 126/72 | HR 70 | Ht 66.0 in | Wt 240.0 lb

## 2018-09-28 DIAGNOSIS — E669 Obesity, unspecified: Secondary | ICD-10-CM

## 2018-09-28 DIAGNOSIS — G4730 Sleep apnea, unspecified: Secondary | ICD-10-CM

## 2018-09-28 DIAGNOSIS — G4733 Obstructive sleep apnea (adult) (pediatric): Secondary | ICD-10-CM

## 2018-09-28 NOTE — Progress Notes (Signed)
[facility]  Chief Complaint  Patient presents with  . Follow-up    Pt is not sleeping well in last 2 months due to family situations. Pt states cpap machine is doing well overall.    Constitutional:  BP 126/72 (BP Location: Left Arm, Cuff Size: Normal)   Pulse 70   Ht 5\' 6"  (1.676 m)   Wt 240 lb (108.9 kg)   SpO2 99%   BMI 38.74 kg/m   Past Medical History:  Anxiety, OA, DM, GERD, HLD, HTN, Allergies  Brief Summary:  Trevor Ellis is a 67 y.o. male with obstructive sleep apnea.  He is doing well with CPAP.  Has hybrid mask.  No issues with mask fit.  Not having sinus congestion, sore throat, dry mouth, or aerophagia.  He feels that having auto range 10 to 20 cm H2O has worked better.  He can only get about 4 to 5 hrs sleep at night.  His wife doesn't like to go to bed early and wants him to stay awake with her.  He also had several family members pass away recently, and this has been a hard adjustment.  He will take a nap for about an hour in the afternoon, but doesn't use his CPAP then.   Physical Exam:   Appearance - well kempt   ENMT - clear nasal mucosa, midline nasal  septum, no oral exudates, no LAN, trachea midline, MP 3, scalloped tongue  Respiratory - normal chest wall, normal respiratory effort, no accessory muscle use, no wheeze/rales  CV - s1s2 regular rate and rhythm, no murmurs, no peripheral edema, radial pulses symmetric  GI - soft, non tender, no masses  Lymph - no adenopathy noted in neck and axillary areas  MSK - normal gait  Ext - no cyanosis, clubbing, or joint inflammation noted  Skin - no rashes, lesions, or ulcers  Neuro - normal strength, oriented x 3  Psych - normal mood and affect   Assessment/Plan:   Obstructive sleep apnea. - he is compliant with CPAP and reports benefit - continue auto CPAP 10 to 20 cm H2O - advised him to use CPAP for entire time he is asleep, including  naps  Insufficient sleep. - discussed options to try and allow for more time to sleep  Obesity. - discussed importance of weight loss   Patient Instructions  Follow up in 1 year    Chesley Mires, MD [place] Pager: [phone] 09/28/2018, 9:29 AM  Flow Sheet    Sleep tests:  HST 01/04/18 >> AHI 81.2, SaO2 low 62%. Auto CPAP 08/28/18 to 09/26/18 >> used on 30 of 30 nights with average 4 hrs 45 min.  Average AHI 8 with median CPAP 15 and 95 th percentile CPAP 18 cm H2O.  Medications:   Allergies as of 09/28/2018   No Known Allergies     Medication List        Accurate as of 09/28/18  9:29 AM. Always use your most recent med list.          atorvastatin 40 MG tablet Commonly known as:  LIPITOR Take 40 mg by mouth daily.   chlorthalidone 25 MG tablet Commonly known as:  HYGROTON Take 25 mg by mouth daily.   colesevelam 625 MG tablet Commonly known as:  WELCHOL Take 1,875 mg by mouth 2 (two) times daily with a meal.   FISH OIL PO Take by mouth. 1000 mg 2 twice daily   glimepiride 1  MG tablet Commonly known as:  AMARYL Take 1 mg by mouth daily before breakfast.   JARDIANCE 10 MG Tabs tablet Generic drug:  empagliflozin Take 10 mg by mouth daily.   lisinopril 40 MG tablet Commonly known as:  PRINIVIL,ZESTRIL Take 40 mg by mouth daily.   metFORMIN 500 MG tablet Commonly known as:  GLUCOPHAGE Take 500 mg by mouth 2 (two) times daily with a meal.   ranitidine 150 MG tablet Commonly known as:  ZANTAC Take 150 mg by mouth 2 (two) times daily.   venlafaxine 75 MG tablet Commonly known as:  EFFEXOR Take 75 mg by mouth 2 (two) times daily.       Past Surgical History:  He  has a past surgical history that includes Colonoscopy (2008) and Cataract extraction.  Family History:  His family history includes Colitis in his brother; Prostate cancer in his father.  Social History:  He  reports that he has never smoked. He has never  used smokeless tobacco. He reports that he drinks alcohol. He reports that he does not use drugs.

## 2018-09-28 NOTE — Patient Instructions (Signed)
Follow up in 1 year.

## 2021-07-22 ENCOUNTER — Encounter: Payer: Self-pay | Admitting: Gastroenterology
